# Patient Record
Sex: Female | Born: 1965 | Race: White | Hispanic: No | Marital: Married | Smoking: Current every day smoker
Health system: Southern US, Community
[De-identification: ages and names within clinical notes are randomized; demographics above are authoritative.]

## PROBLEM LIST (undated history)

## (undated) DIAGNOSIS — I10 Essential (primary) hypertension: Secondary | ICD-10-CM

## (undated) DIAGNOSIS — I639 Cerebral infarction, unspecified: Secondary | ICD-10-CM

## (undated) HISTORY — DX: Essential (primary) hypertension: I10

## (undated) HISTORY — DX: Cerebral infarction, unspecified: I63.9

## (undated) HISTORY — PX: TONSILLECTOMY: SUR1361

---

## 2021-03-17 ENCOUNTER — Encounter (HOSPITAL_BASED_OUTPATIENT_CLINIC_OR_DEPARTMENT_OTHER): Payer: Self-pay

## 2021-03-17 ENCOUNTER — Other Ambulatory Visit: Payer: Self-pay

## 2021-03-17 ENCOUNTER — Emergency Department (HOSPITAL_COMMUNITY)
Admission: EM | Admit: 2021-03-17 | Discharge: 2021-03-17 | Disposition: A | Discharge status: Home / Self Care | Payer: Commercial Managed Care - PPO | Attending: Emergency Medicine | Admitting: Emergency Medicine

## 2021-03-17 ENCOUNTER — Emergency Department (HOSPITAL_BASED_OUTPATIENT_CLINIC_OR_DEPARTMENT_OTHER)
Admission: EM | Admit: 2021-03-17 | Discharge: 2021-03-17 | Disposition: A | Discharge status: Home / Self Care | Payer: Commercial Managed Care - PPO | Attending: Emergency Medicine | Admitting: Emergency Medicine

## 2021-03-17 ENCOUNTER — Emergency Department (HOSPITAL_BASED_OUTPATIENT_CLINIC_OR_DEPARTMENT_OTHER): Payer: Commercial Managed Care - PPO

## 2021-03-17 DIAGNOSIS — R5383 Other fatigue: Secondary | ICD-10-CM | POA: Insufficient documentation

## 2021-03-17 DIAGNOSIS — I10 Essential (primary) hypertension: Secondary | ICD-10-CM | POA: Diagnosis not present

## 2021-03-17 DIAGNOSIS — R0789 Other chest pain: Secondary | ICD-10-CM | POA: Diagnosis not present

## 2021-03-17 DIAGNOSIS — F17210 Nicotine dependence, cigarettes, uncomplicated: Secondary | ICD-10-CM | POA: Insufficient documentation

## 2021-03-17 DIAGNOSIS — R030 Elevated blood-pressure reading, without diagnosis of hypertension: Secondary | ICD-10-CM | POA: Diagnosis present

## 2021-03-17 DIAGNOSIS — R519 Headache, unspecified: Secondary | ICD-10-CM | POA: Diagnosis not present

## 2021-03-17 DIAGNOSIS — Z5321 Procedure and treatment not carried out due to patient leaving prior to being seen by health care provider: Secondary | ICD-10-CM | POA: Insufficient documentation

## 2021-03-17 LAB — CBC WITH DIFFERENTIAL/PLATELET
Abs Immature Granulocytes: 0.03 10*3/uL (ref 0.00–0.07)
Basophils Absolute: 0 10*3/uL (ref 0.0–0.1)
Basophils Relative: 1 %
Eosinophils Absolute: 0.2 10*3/uL (ref 0.0–0.5)
Eosinophils Relative: 3 %
HCT: 40.9 % (ref 36.0–46.0)
Hemoglobin: 13.4 g/dL (ref 12.0–15.0)
Immature Granulocytes: 0 %
Lymphocytes Relative: 31 %
Lymphs Abs: 2.2 10*3/uL (ref 0.7–4.0)
MCH: 31.9 pg (ref 26.0–34.0)
MCHC: 32.8 g/dL (ref 30.0–36.0)
MCV: 97.4 fL (ref 80.0–100.0)
Monocytes Absolute: 0.4 10*3/uL (ref 0.1–1.0)
Monocytes Relative: 6 %
Neutro Abs: 4.2 10*3/uL (ref 1.7–7.7)
Neutrophils Relative %: 59 %
Platelets: 211 10*3/uL (ref 150–400)
RBC: 4.2 MIL/uL (ref 3.87–5.11)
RDW: 14.3 % (ref 11.5–15.5)
WBC: 7.1 10*3/uL (ref 4.0–10.5)
nRBC: 0 % (ref 0.0–0.2)

## 2021-03-17 LAB — TROPONIN I (HIGH SENSITIVITY): Troponin I (High Sensitivity): 3 ng/L (ref <18)

## 2021-03-17 LAB — COMPREHENSIVE METABOLIC PANEL
ALT: 13 U/L (ref 0–44)
AST: 19 U/L (ref 15–41)
Albumin: 3.5 g/dL (ref 3.5–5.0)
Alkaline Phosphatase: 48 U/L (ref 38–126)
Anion gap: 8 (ref 5–15)
BUN: 15 mg/dL (ref 6–20)
CO2: 29 mmol/L (ref 22–32)
Calcium: 8.4 mg/dL — ABNORMAL LOW (ref 8.9–10.3)
Chloride: 101 mmol/L (ref 98–111)
Creatinine, Ser: 1.04 mg/dL — ABNORMAL HIGH (ref 0.44–1.00)
GFR, Estimated: >60 mL/min (ref >60)
Glucose, Bld: 125 mg/dL — ABNORMAL HIGH (ref 70–99)
Potassium: 3.5 mmol/L (ref 3.5–5.1)
Sodium: 138 mmol/L (ref 135–145)
Total Bilirubin: 0.3 mg/dL (ref 0.3–1.2)
Total Protein: 6.4 g/dL — ABNORMAL LOW (ref 6.5–8.1)

## 2021-03-17 NOTE — Discharge Instructions (Addendum)
Increase your lisinopril to 40 mg once daily.  Follow up with your primary care doctor or cardiologist in the next 1-2 weeks.

## 2021-03-17 NOTE — ED Notes (Signed)
Pt stated she is leaving because she is too concerned about her BP

## 2021-03-17 NOTE — ED Provider Notes (Signed)
[facility] EMERGENCY DEPARTMENT Provider Note   CSN: [id number] Arrival date & time: 03/17/21  2035     History Chief Complaint  Patient presents with   Hypertension    Natalie Massey is a 55 y.o. female.  The history is provided by the patient and medical records.  Hypertension Natalie Massey is a 55 y.o. female who presents to the Emergency Department complaining of HTN.  She has a hx/o HTN.  Takes lisinopril, bisoprolol, clonidine.  No recent changes or missed doses.    Today she is fatigued.  She states if you touch her chest she feels pressure starting today.  She is having some difficulty with focusing/vision for the last year.  Has afternoon headaches - on and off for the last two weeks. No HA today.    Checks her blood pressure at home and lately has been running 190/110-120.  Has night sweats for the last 2-3 months.  Has intermittent sob, thought it was related to having covid - possibly three months ago.   No cough, abdominal, vomiting, diarrhea. No leg edema.      Past Medical History:  Diagnosis Date   Hypertension    Stroke [facility])     There are no problems to display for this patient.   Past Surgical History:  Procedure Laterality Date   CESAREAN SECTION     TONSILLECTOMY       OB History   No obstetric history on file.     No family history on file.  Social History   Tobacco Use   Smoking status: Every Day    Pack years: 0.00    Types: Cigarettes   Smokeless tobacco: Never  Substance Use Topics   Alcohol use: Yes    Comment: occ   Drug use: Never    Home Medications Prior to Admission medications   Not on File    Allergies    Benadryl [diphenhydramine], Hydrocodone, and Prochlorperazine  Review of Systems   Review of Systems  All other systems reviewed and are negative.  Physical Exam Updated Vital Signs BP (!) 180/105   Pulse 64   Temp 98.9 F (37.2 C) (Oral)   Resp 14   Ht 5\' 6"  (1.676 m)   Wt 73.5 kg    SpO2 91%   BMI 26.15 kg/m   Physical Exam Vitals and nursing note reviewed.  Constitutional:      Appearance: She is well-developed.  HENT:     Head: Normocephalic and atraumatic.  Cardiovascular:     Rate and Rhythm: Normal rate and regular rhythm.     Heart sounds: No murmur heard. Pulmonary:     Effort: Pulmonary effort is normal. No respiratory distress.     Breath sounds: Normal breath sounds.  Abdominal:     Palpations: Abdomen is soft.     Tenderness: There is no abdominal tenderness. There is no guarding or rebound.  Musculoskeletal:        General: No tenderness.  Skin:    General: Skin is warm and dry.  Neurological:     Mental Status: She is alert and oriented to person, place, and time.     Comments: No asymmetry of facial movements.  Visual fields grossly intact.  No pronator drift.  5/5 strength in all four extremities with sensation to light touch intact in all four extremities.    Psychiatric:        Behavior: Behavior normal.    ED Results / Procedures / Treatments  Labs (all labs ordered are listed, but only abnormal results are displayed) Labs Reviewed  COMPREHENSIVE METABOLIC PANEL - Abnormal; Notable for the following components:      Result Value   Glucose, Bld 125 (*)    Creatinine, Ser 1.04 (*)    Calcium 8.4 (*)    Total Protein 6.4 (*)    All other components within normal limits  CBC WITH DIFFERENTIAL/PLATELET  TROPONIN I (HIGH SENSITIVITY)  TROPONIN I (HIGH SENSITIVITY)    EKG EKG Interpretation  Date/Time:  Wednesday March 17 2021 20:50:23 EDT Ventricular Rate:  72 PR Interval:  142 QRS Duration: 100 QT Interval:  434 QTC Calculation: 475 R Axis:   74 Text Interpretation: Normal sinus rhythm Nonspecific ST abnormality Abnormal ECG Confirmed by Tilden Fossa [phone]) on 03/17/2021 10:40:13 PM  Radiology DG Chest 2 View  Result Date: 03/17/2021 CLINICAL DATA:  Chest pain hypertension EXAM: CHEST - 2 VIEW COMPARISON:  None.  FINDINGS: The heart size and mediastinal contours are within normal limits. Both lungs are clear. The visualized skeletal structures are unremarkable. IMPRESSION: No active cardiopulmonary disease. Electronically Signed   By: Jasmine Pang M.D.   On: 03/17/2021 21:50   CT Head Wo Contrast  Result Date: 03/17/2021 CLINICAL DATA:  Headache. EXAM: CT HEAD WITHOUT CONTRAST TECHNIQUE: Contiguous axial images were obtained from the base of the skull through the vertex without intravenous contrast. COMPARISON:  None. FINDINGS: Brain: No evidence of acute infarction, hemorrhage, hydrocephalus, extra-axial collection or mass lesion/mass effect. Vascular: No hyperdense vessel or unexpected calcification. Skull: Normal. Negative for fracture or focal lesion. Sinuses/Orbits: No acute finding. Other: None. IMPRESSION: No acute intracranial abnormality. Electronically Signed   By: Aram Candela M.D.   On: 03/17/2021 21:47    Procedures Procedures   Medications Ordered in ED Medications - No data to display  ED Course  I have reviewed the triage vital signs and the nursing notes.  Pertinent labs & imaging results that were available during my care of the patient were reviewed by me and considered in my medical decision making (see chart for details).    MDM Rules/Calculators/A&P                         patient here for evaluation of concern for elevated blood pressure. She does have a history of hypertension. She has been having intermittent headaches, vision changes for about one year as well as 2 to 3 months of intermittent diaphoresis. Today she did have chest pressure as well. Troponin is negative today. She was significantly hypertensive on ED presentation, blood pressure did improve without intervention. Presentation is not consistent with CVA, hypertensive emergency, acute pulmonary edema, ACS, PE, dissection. It will increase her lisinopril with close outpatient follow-up and return  precautions.  Final Clinical Impression(s) / ED Diagnoses Final diagnoses:  Essential hypertension    Rx / DC Orders ED Discharge Orders     None        Tilden Fossa, MD 03/18/21 [phone]

## 2021-03-17 NOTE — ED Triage Notes (Addendum)
Pt c/o BP elevated x 3 days-states she checked BP due to c/o "HA, feeling shaky" and vision changes-reports compliant HTN-pt NAD-steady gait-pt later added c/o "chest pressure" x today-denies as pain for pain scale

## 2021-12-30 ENCOUNTER — Emergency Department (HOSPITAL_BASED_OUTPATIENT_CLINIC_OR_DEPARTMENT_OTHER)
Admission: EM | Admit: 2021-12-30 | Discharge: 2021-12-30 | Disposition: A | Discharge status: Home / Self Care | Payer: Commercial Managed Care - PPO | Attending: Emergency Medicine | Admitting: Emergency Medicine

## 2021-12-30 ENCOUNTER — Encounter (HOSPITAL_BASED_OUTPATIENT_CLINIC_OR_DEPARTMENT_OTHER): Payer: Self-pay | Admitting: Urology

## 2021-12-30 ENCOUNTER — Emergency Department (HOSPITAL_BASED_OUTPATIENT_CLINIC_OR_DEPARTMENT_OTHER): Payer: Commercial Managed Care - PPO

## 2021-12-30 ENCOUNTER — Other Ambulatory Visit: Payer: Self-pay

## 2021-12-30 DIAGNOSIS — R202 Paresthesia of skin: Secondary | ICD-10-CM | POA: Diagnosis present

## 2021-12-30 DIAGNOSIS — I10 Essential (primary) hypertension: Secondary | ICD-10-CM | POA: Diagnosis not present

## 2021-12-30 DIAGNOSIS — R519 Headache, unspecified: Secondary | ICD-10-CM | POA: Diagnosis not present

## 2021-12-30 DIAGNOSIS — M25512 Pain in left shoulder: Secondary | ICD-10-CM | POA: Insufficient documentation

## 2021-12-30 LAB — BASIC METABOLIC PANEL
Anion gap: 8 (ref 5–15)
BUN: 10 mg/dL (ref 6–20)
CO2: 29 mmol/L (ref 22–32)
Calcium: 8.6 mg/dL — ABNORMAL LOW (ref 8.9–10.3)
Chloride: 101 mmol/L (ref 98–111)
Creatinine, Ser: 1.23 mg/dL — ABNORMAL HIGH (ref 0.44–1.00)
GFR, Estimated: 52 mL/min — ABNORMAL LOW (ref >60)
Glucose, Bld: 94 mg/dL (ref 70–99)
Potassium: 4.1 mmol/L (ref 3.5–5.1)
Sodium: 138 mmol/L (ref 135–145)

## 2021-12-30 LAB — CBC WITH DIFFERENTIAL/PLATELET
Abs Immature Granulocytes: 0.03 10*3/uL (ref 0.00–0.07)
Basophils Absolute: 0 10*3/uL (ref 0.0–0.1)
Basophils Relative: 0 %
Eosinophils Absolute: 0.2 10*3/uL (ref 0.0–0.5)
Eosinophils Relative: 3 %
HCT: 45.7 % (ref 36.0–46.0)
Hemoglobin: 14.7 g/dL (ref 12.0–15.0)
Immature Granulocytes: 0 %
Lymphocytes Relative: 34 %
Lymphs Abs: 3 10*3/uL (ref 0.7–4.0)
MCH: 31.3 pg (ref 26.0–34.0)
MCHC: 32.2 g/dL (ref 30.0–36.0)
MCV: 97.2 fL (ref 80.0–100.0)
Monocytes Absolute: 0.7 10*3/uL (ref 0.1–1.0)
Monocytes Relative: 8 %
Neutro Abs: 4.9 10*3/uL (ref 1.7–7.7)
Neutrophils Relative %: 55 %
Platelets: 240 10*3/uL (ref 150–400)
RBC: 4.7 MIL/uL (ref 3.87–5.11)
RDW: 14.2 % (ref 11.5–15.5)
WBC: 8.8 10*3/uL (ref 4.0–10.5)
nRBC: 0 % (ref 0.0–0.2)

## 2021-12-30 MED ORDER — DEXAMETHASONE SODIUM PHOSPHATE 10 MG/ML IJ SOLN
10.0000 mg | Freq: Once | INTRAMUSCULAR | Status: AC
  Administered 2021-12-30: 10 mg via INTRAVENOUS
  Filled 2021-12-30: qty 1

## 2021-12-30 MED ORDER — METHYLPREDNISOLONE 4 MG PO TBPK: ORAL_TABLET | ORAL | 0 refills | Status: AC

## 2021-12-30 NOTE — ED Triage Notes (Addendum)
Numbness and tingling to left arm that started 2 days ago,  states left eye swelling  ?States unable to straighten arm, denies weakness ?BEFAST and VAN neg ?H/o Stoke, facial droop at baseline ?States shakiness normal at baseline ? ?Pt hypertensive at triage ? ?

## 2021-12-30 NOTE — ED Notes (Signed)
Patient transported to CT 

## 2021-12-30 NOTE — Discharge Instructions (Signed)
Take next dose of steroid tomorrow.  Follow-up with your neurologist. ?

## 2021-12-30 NOTE — ED Provider Notes (Signed)
?[facility] EMERGENCY DEPARTMENT ?Provider Note ? ? ?CSN: [id number] ?Arrival date & time: 12/30/21  1855 ? ?  ? ?History ? ?Chief Complaint  ?Patient presents with  ? Numbness  ? ? ?Natalie Massey is a 56 y.o. female. ? ?Patient with history of hypertension, possibly stroke who presents to the ED with numbness in the left upper arm.  She is having paresthesias and pain and numbness between her left shoulder and left elbow.  Denies any weakness.  Denies any trauma.  Denies any neck pain.  Does have intermittent headaches at times.  She has been dealing with tremors especially in her upper extremities for the last several months.  She has referral to neurology for this.  Denies any falls.  Denies any vision issues or vision loss at this time.  Paresthesias have been ongoing for about 3 days.  Has history of nerve issues but none in the upper extremities. ? ?The history is provided by the patient.  ?Illness ?Severity:  Mild ?Onset quality:  Gradual ?Duration:  3 days ?Timing:  Constant ?Progression:  Unchanged ?Chronicity:  New ?Associated symptoms: no abdominal pain, no chest pain, no congestion, no cough, no diarrhea, no ear pain, no fatigue, no fever, no headaches, no loss of consciousness, no myalgias, no rash, no rhinorrhea, no shortness of breath, no sore throat, no vomiting and no wheezing   ? ?  ? ?Home Medications ?Prior to Admission medications   ?Medication Sig Start Date End Date Taking? Authorizing Provider  ?methylPREDNISolone (MEDROL DOSEPAK) 4 MG TBPK tablet Follow package insert 12/30/21  Yes Virgina Norfolk, DO  ?   ? ?Allergies    ?Benadryl [diphenhydramine], Hydrocodone, and Prochlorperazine   ? ?Review of Systems   ?Review of Systems  ?Constitutional:  Negative for fatigue and fever.  ?HENT:  Negative for congestion, ear pain, rhinorrhea and sore throat.   ?Respiratory:  Negative for cough, shortness of breath and wheezing.   ?Cardiovascular:  Negative for chest pain.  ?Gastrointestinal:   Negative for abdominal pain, diarrhea and vomiting.  ?Musculoskeletal:  Negative for myalgias.  ?Skin:  Negative for rash.  ?Neurological:  Negative for loss of consciousness and headaches.  ? ?Physical Exam ?Updated Vital Signs ?BP (!) 188/110   Pulse 72   Temp 98.7 ?F (37.1 ?C) (Oral)   Resp 18   Ht 5\' 6"  (1.676 m)   Wt 73.5 kg   SpO2 94%   BMI 26.15 kg/m?  ?Physical Exam ?Vitals and nursing note reviewed.  ?Constitutional:   ?   General: She is not in acute distress. ?   Appearance: She is well-developed. She is not ill-appearing.  ?HENT:  ?   Head: Normocephalic and atraumatic.  ?   Nose: Nose normal.  ?   Mouth/Throat:  ?   Mouth: Mucous membranes are moist.  ?Eyes:  ?   Extraocular Movements: Extraocular movements intact.  ?   Conjunctiva/sclera: Conjunctivae normal.  ?   Pupils: Pupils are equal, round, and reactive to light.  ?Cardiovascular:  ?   Rate and Rhythm: Normal rate and regular rhythm.  ?   Pulses: Normal pulses.  ?   Heart sounds: Normal heart sounds. No murmur heard. ?Pulmonary:  ?   Effort: Pulmonary effort is normal. No respiratory distress.  ?   Breath sounds: Normal breath sounds.  ?Abdominal:  ?   General: Abdomen is flat.  ?   Palpations: Abdomen is soft.  ?   Tenderness: There is no abdominal tenderness.  ?Musculoskeletal:     ?  General: No swelling or tenderness.  ?   Cervical back: Normal range of motion and neck supple.  ?   Comments: Tender to palpation to the left shoulder  ?Skin: ?   General: Skin is warm and dry.  ?   Capillary Refill: Capillary refill takes less than 2 seconds.  ?Neurological:  ?   Mental Status: She is alert.  ?   Cranial Nerves: No cranial nerve deficit.  ?   Sensory: Sensory deficit present.  ?   Motor: No weakness.  ?   Coordination: Coordination normal.  ?   Comments: Patient has some tremors in the upper extremities but 5+ out of 5 strength throughout, decreased sensation along the ulnar portion of the left arm between mid humerus and mid wrist but  otherwise sensations intact otherwise, normal speech, no drift, normal finger-nose-finger, no visual field deficit  ?Psychiatric:     ?   Mood and Affect: Mood normal.  ? ? ?ED Results / Procedures / Treatments   ?Labs ?(all labs ordered are listed, but only abnormal results are displayed) ?Labs Reviewed  ?BASIC METABOLIC PANEL - Abnormal; Notable for the following components:  ?    Result Value  ? Creatinine, Ser 1.23 (*)   ? Calcium 8.6 (*)   ? GFR, Estimated 52 (*)   ? All other components within normal limits  ?CBC WITH DIFFERENTIAL/PLATELET  ? ? ?EKG ?EKG Interpretation ? ?Date/Time:  Thursday December 30 2021 19:10:22 EDT ?Ventricular Rate:  77 ?PR Interval:  132 ?QRS Duration: 108 ?QT Interval:  414 ?QTC Calculation: 468 ?R Axis:   59 ?Text Interpretation: Normal sinus rhythm Incomplete left bundle branch block When compared with ECG of 17-Mar-2021 20:50, PREVIOUS ECG IS PRESENT Confirmed by Virgina Norfolk [phone]) on 12/30/2021 7:14:11 PM ? ?Radiology ?CT Head Wo Contrast ? ?Result Date: 12/30/2021 ?CLINICAL DATA:  Headache, new or worsening (Age >= 50y) numbness, headaceh EXAM: CT HEAD WITHOUT CONTRAST TECHNIQUE: Contiguous axial images were obtained from the base of the skull through the vertex without intravenous contrast. RADIATION DOSE REDUCTION: This exam was performed according to the departmental dose-optimization program which includes automated exposure control, adjustment of the mA and/or kV according to patient size and/or use of iterative reconstruction technique. COMPARISON:  CT head March 17, 2021. FINDINGS: Brain: No evidence of acute infarction, hemorrhage, hydrocephalus, extra-axial collection or mass lesion/mass effect. Vascular: No hyperdense vessel identified. Skull: No acute fracture. Sinuses/Orbits: Moderate right maxillary sinus mucosal thickening and mild right frontal sinus mucosal thickening. Other: No mastoid effusions. IMPRESSION: 1. No evidence of acute intracranial abnormality. 2.  Moderate right maxillary sinus mucosal thickening. Electronically Signed   By: Feliberto Harts M.D.   On: 12/30/2021 19:46  ? ?DG Shoulder Left ? ?Result Date: 12/30/2021 ?CLINICAL DATA:  Pain EXAM: LEFT SHOULDER - 2+ VIEW COMPARISON:  None. FINDINGS: There is no evidence of fracture or dislocation. There is no evidence of arthropathy or other focal bone abnormality. Soft tissues are unremarkable. IMPRESSION: Negative. Electronically Signed   By: Guadlupe Spanish M.D.   On: 12/30/2021 19:54   ? ?Procedures ?Procedures  ? ? ?Medications Ordered in ED ?Medications  ?dexamethasone (DECADRON) injection 10 mg (10 mg Intravenous Given 12/30/21 2003)  ? ? ?ED Course/ Medical Decision Making/ A&P ?  ?                        ?Medical Decision Making ?Amount and/or Complexity of Data Reviewed ?Labs: ordered. ?Radiology: ordered. ? ?  Risk ?Prescription drug management. ? ? ?Westley ChandlerMelissa Chatham is here for evaluation of paresthesias to the left arm.  Discomfort mostly between the mid humerus on the left on the ulnar side to the mid wrist.  She has a lot of discomfort in the left shoulder radiating down from this.  Denies any trauma or falls.  Ongoing for the last 3 days.  Denies any neck pain, headaches, vision changes, weakness currently.  Has had nerve issues in the past.  Has a history of high blood pressure, possibly stroke.  She has had tremors in her upper extremities for a long time now and is supposed to see neurology for this.  Overall this does not fit stroke.  Suspect that this could be some peripheral nerve issue or muscular issue.  But we will pursue CT scan of the head, x-ray of the left shoulder and check basic labs.  Blood pressure elevated upon arrival but otherwise normal vitals.  She is not had her nighttime blood pressure medicines.  She is having some tremors and that might be affecting her blood pressure reading as well. ? ?CT scan of the head shows no acute findings.  No head bleed.  No evidence of old strokes.   X-ray per my review interpretation of her left shoulder shows no fracture dislocation or severe arthritis.  She has no significant electrolyte abnormality, kidney injury, leukocytosis.  Overall we will treat with s

## 2022-06-24 IMAGING — DX DG SHOULDER 2+V*L*
4 series · 4 of 4 positions shown · non-contrast
Comparison: None.

CLINICAL DATA: Pain

EXAM:
LEFT SHOULDER - 2+ VIEW

[shoulder grashey]
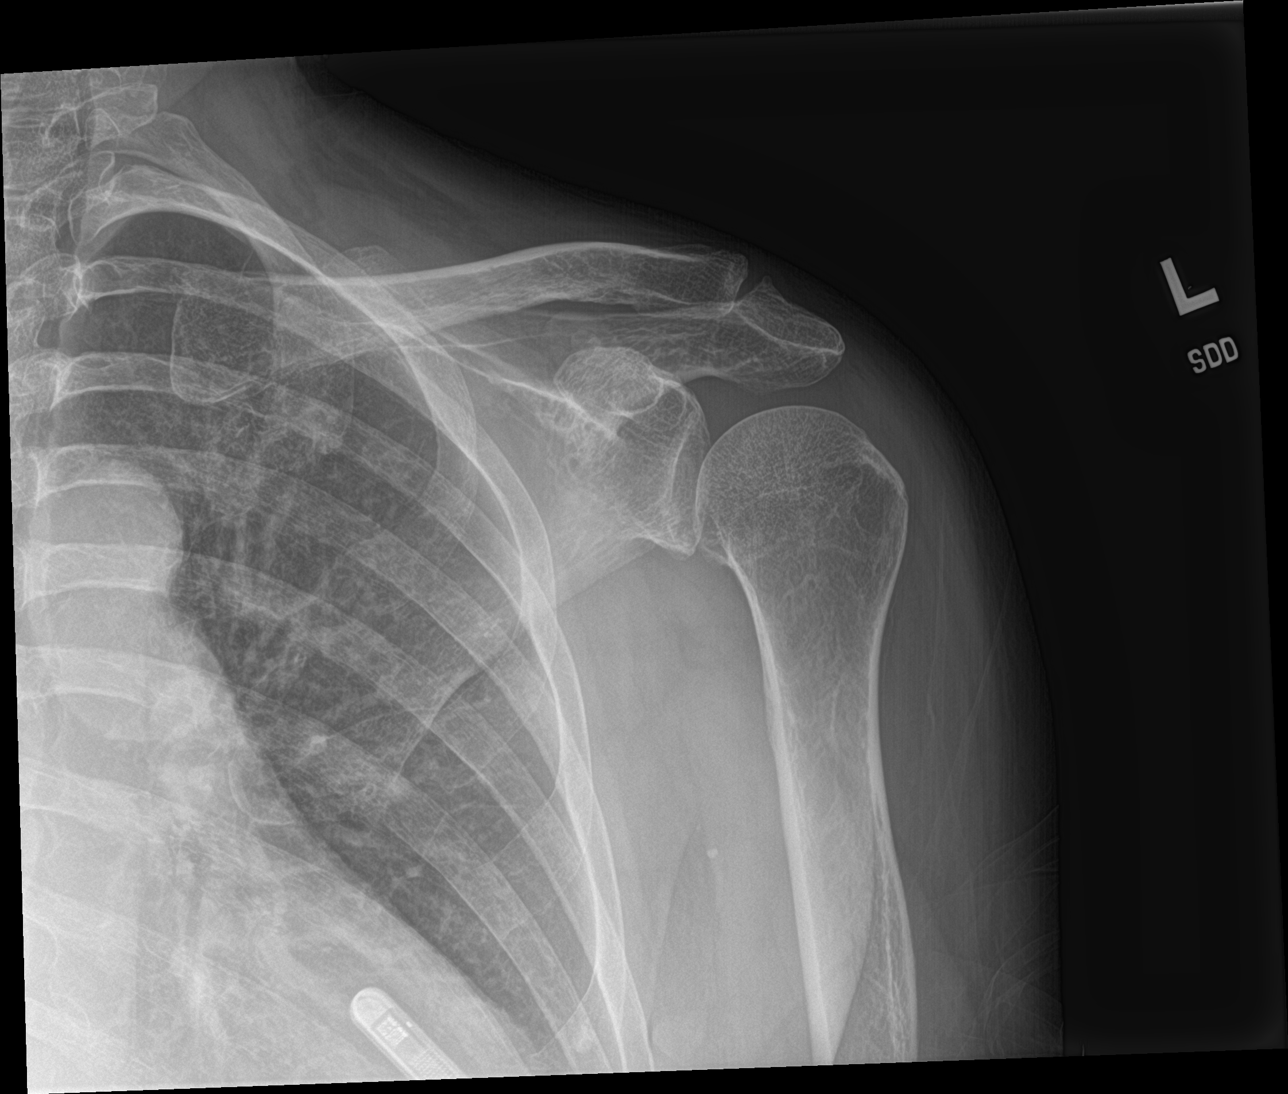

[shoulder y view]
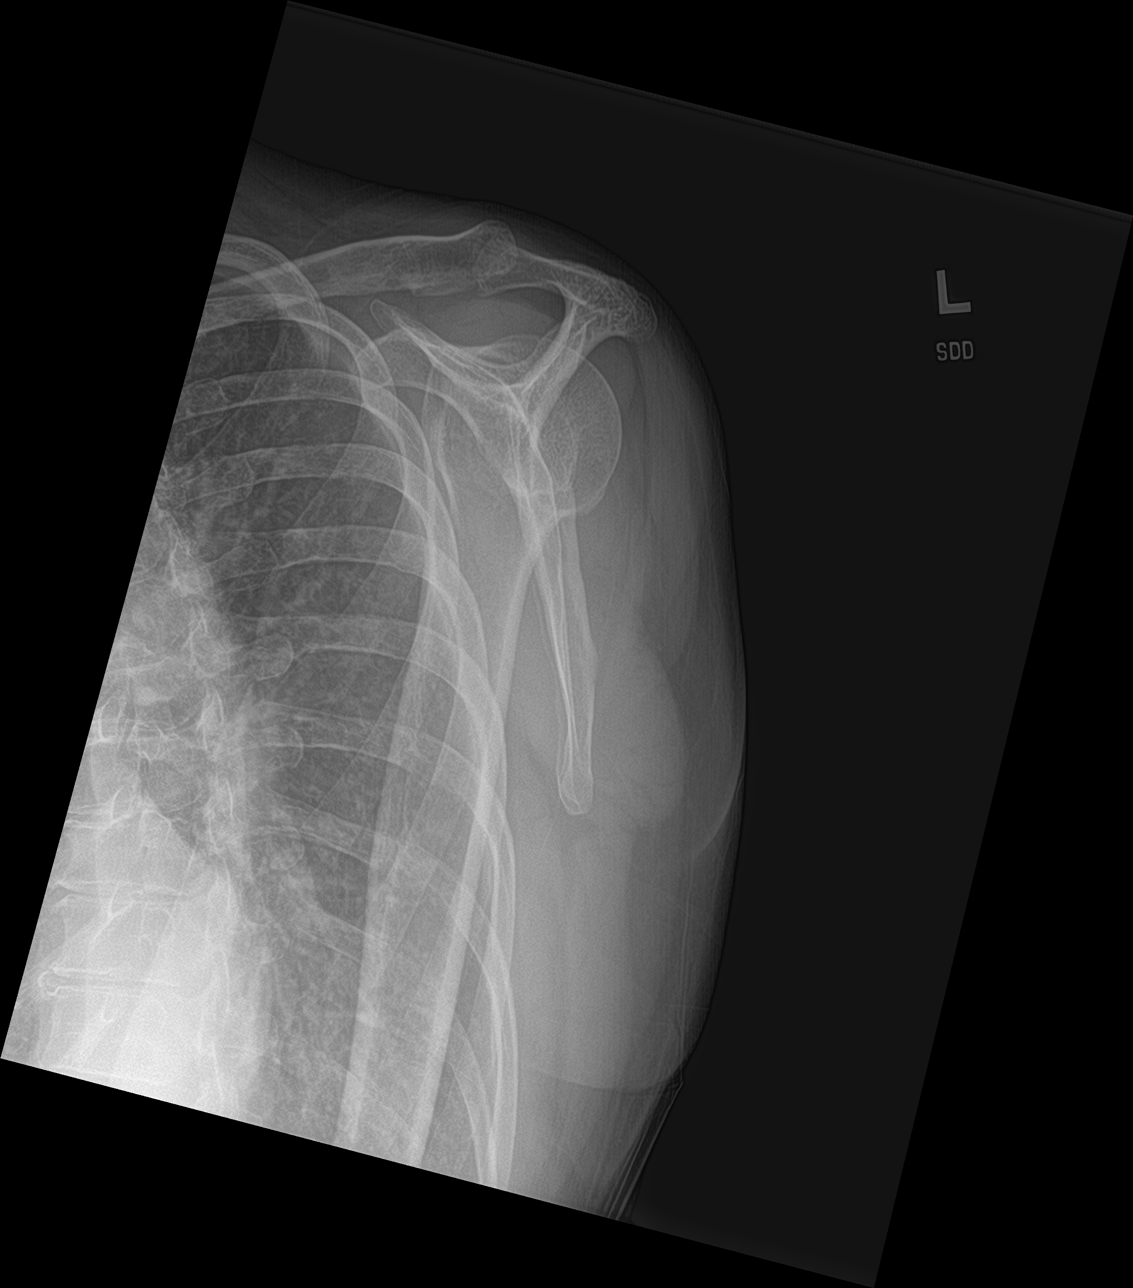

[shoulder axillary]
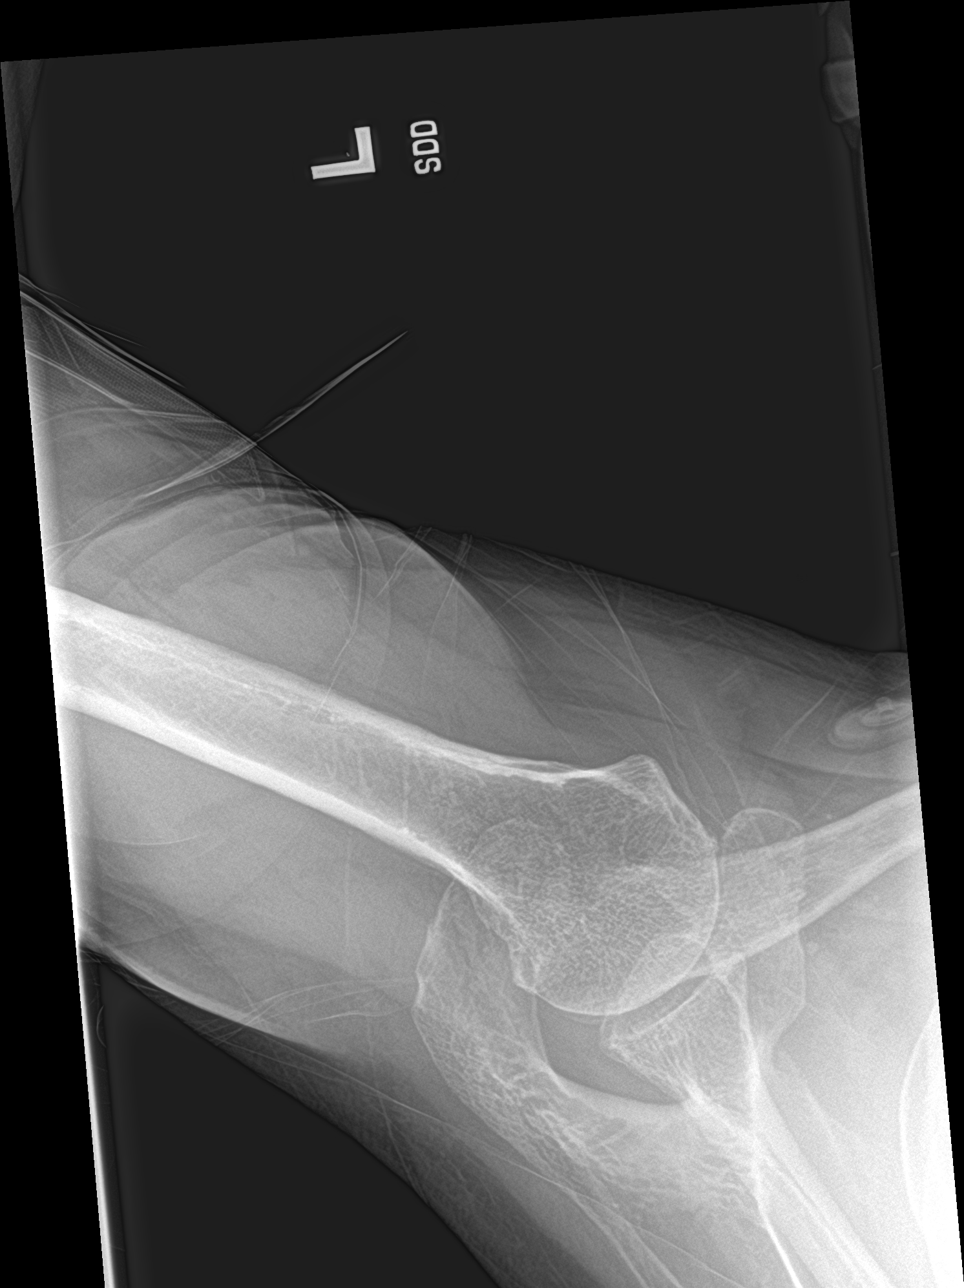

[shoulder ap neutral]
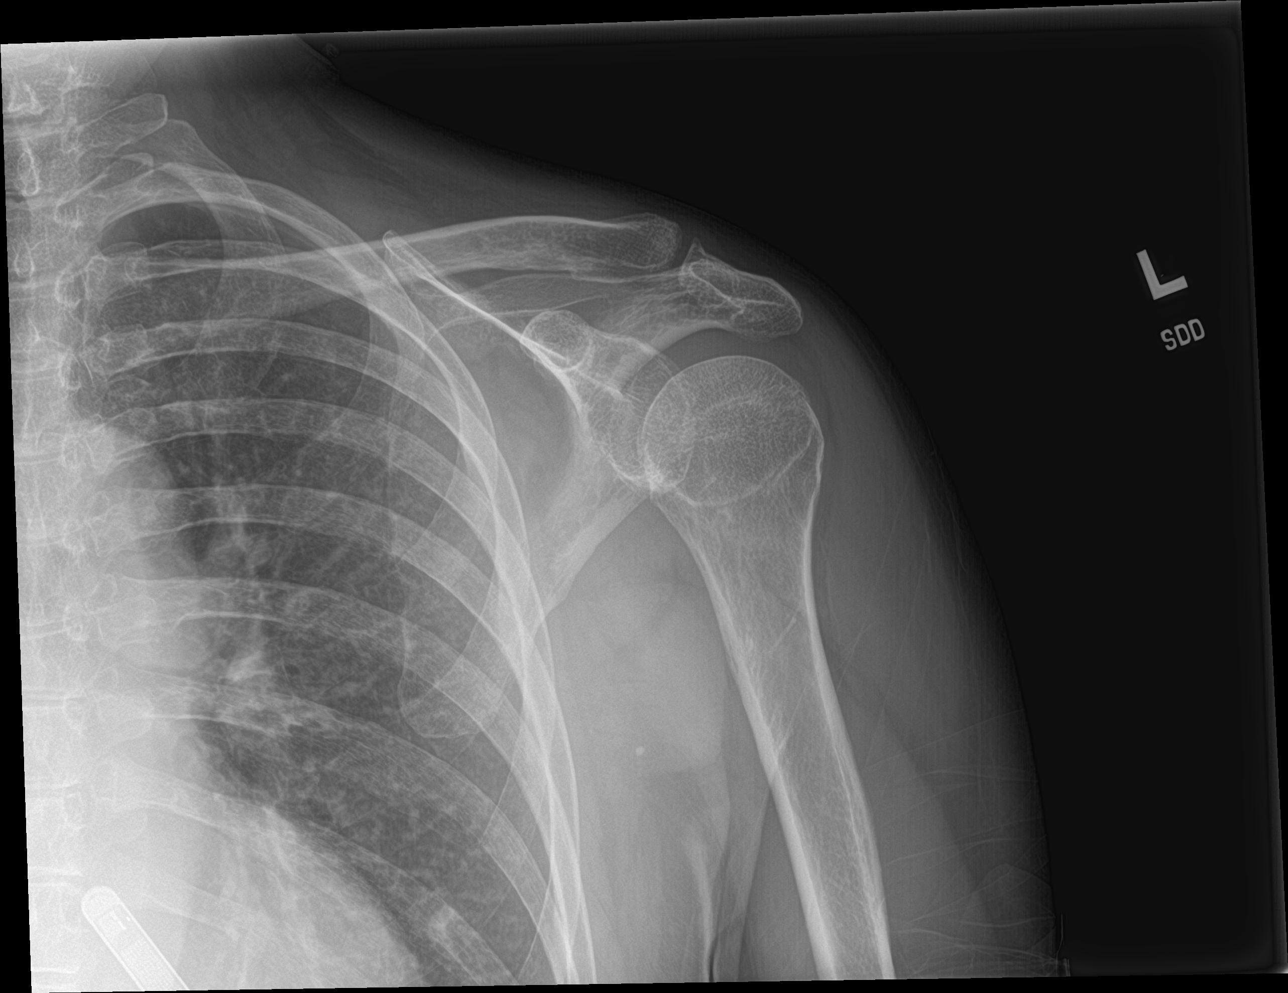

[4 of 4 positions shown; findings below may reference images not displayed]

FINDINGS: There is no evidence of fracture or dislocation. There is no
evidence of arthropathy or other focal bone abnormality. Soft
tissues are unremarkable.
IMPRESSION: Negative.

## 2022-06-24 IMAGING — CT CT HEAD W/O CM
3 series · 14 of 47 positions shown, 16 images · non-contrast
Comparison: CT head March 17, 2021.

CLINICAL DATA: Headache, new or worsening (Age >= 50y) numbness,
headaceh



[Series 2: head wo · axial · 0.47mm/px · z∈[-82,+74]mm · 8 of 37 slices shown, 10 images]
[im 3/37  brain]
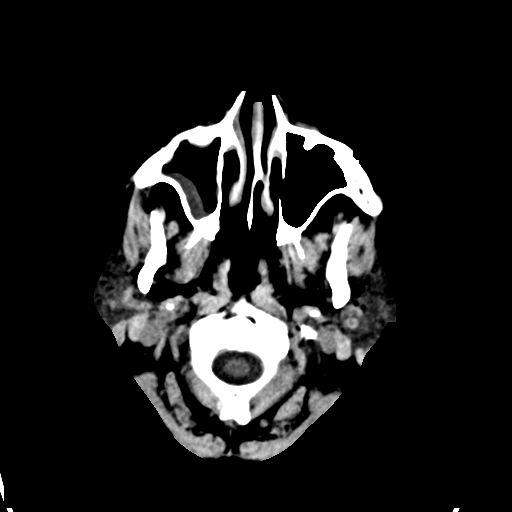
[im 3/37  bone]
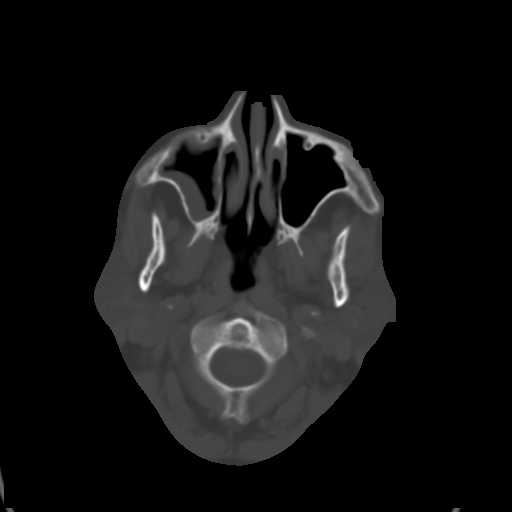
[im 8/37  brain]
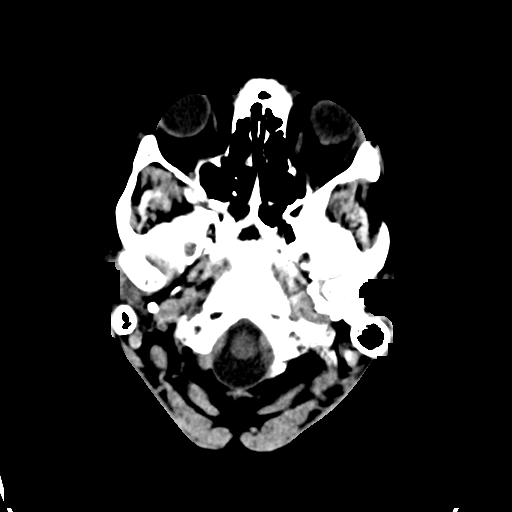
[im 12/37  brain]
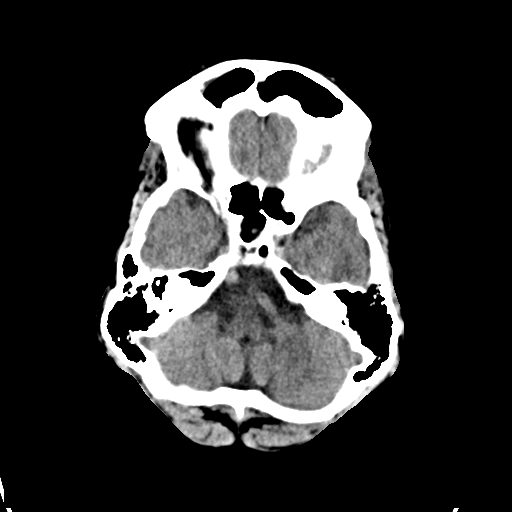
[im 17/37  brain]
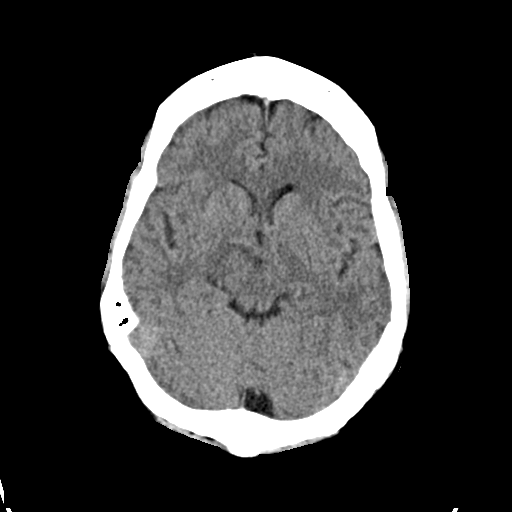
[im 20/37  brain]
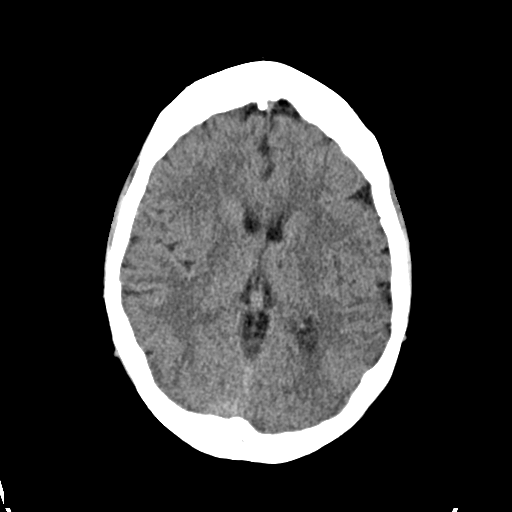
[im 20/37  bone]
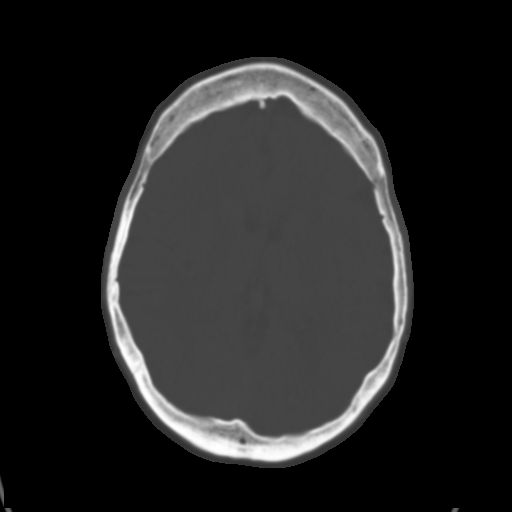
[im 25/37  brain]
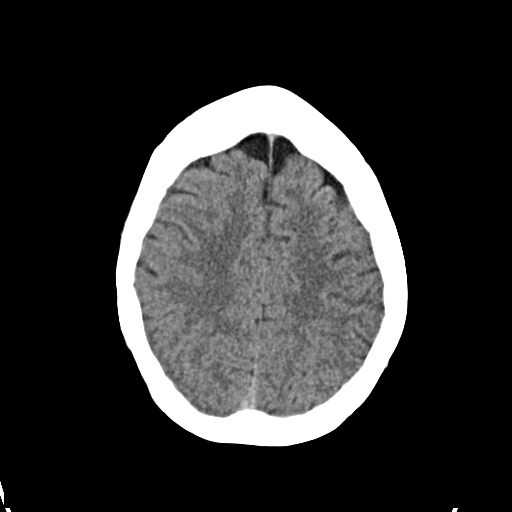
[im 29/37  brain]
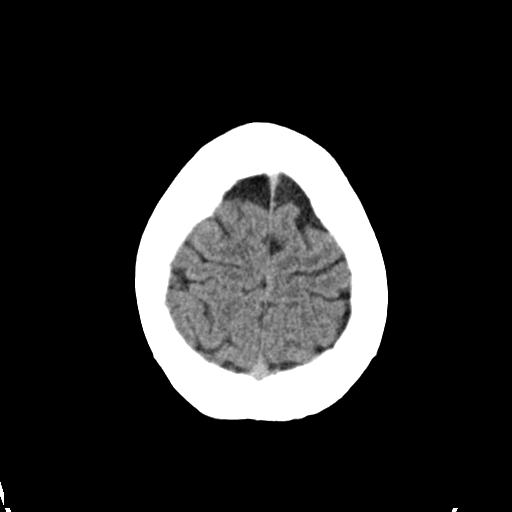
[im 34/37  brain]
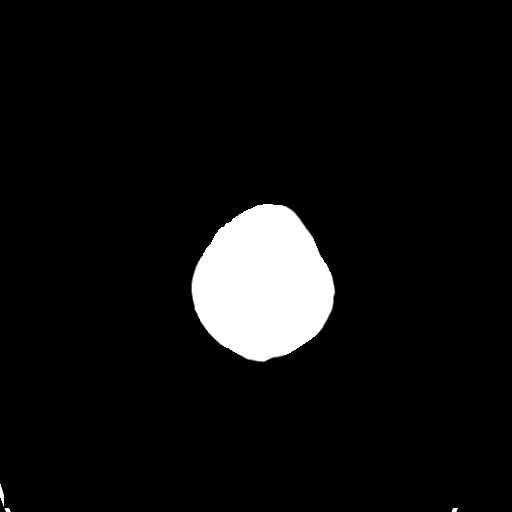

[Series 4: coronal soft · coronal · 0.36mm/px · 3 of 69 slices shown]
[im 23/69  brain]
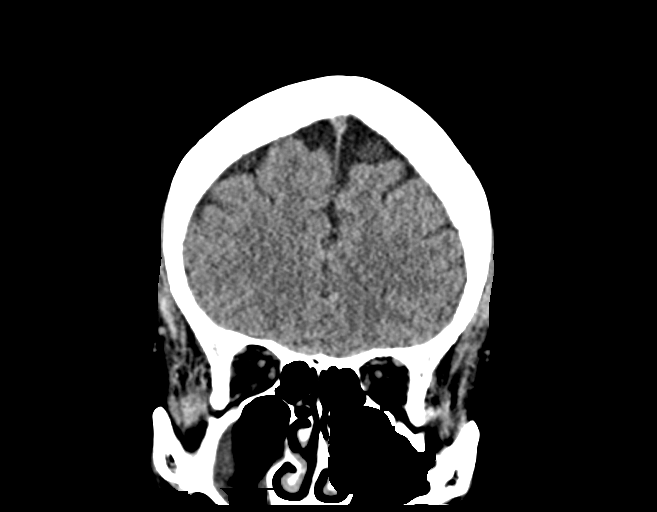
[im 31/69  brain]
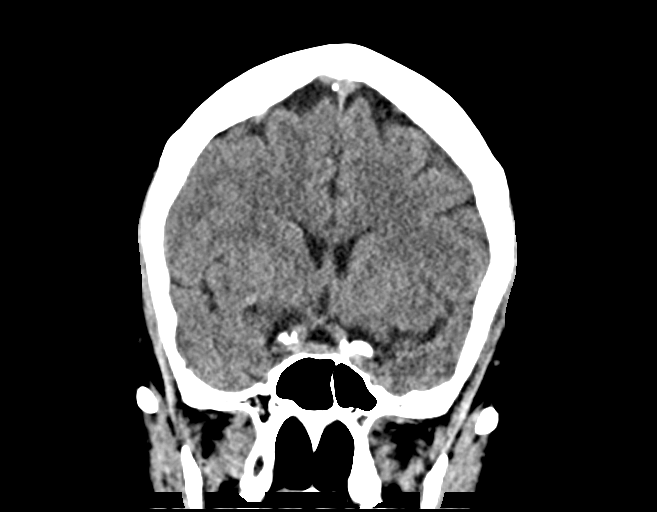
[im 38/69  brain]
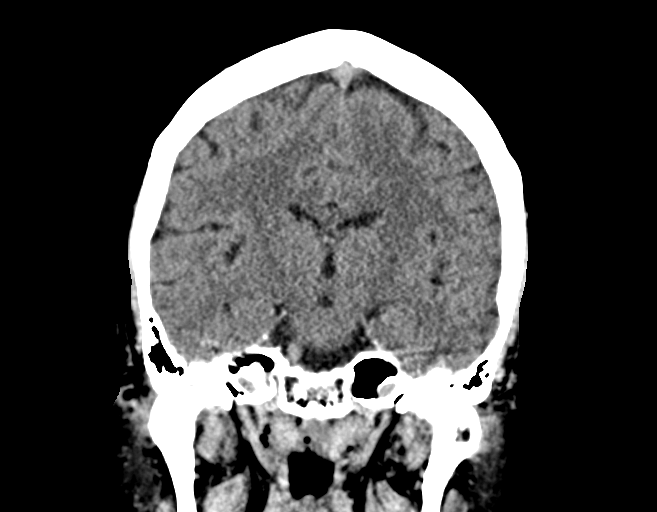

[Series 5: sag soft · sagittal · 0.36mm/px · 3 of 67 slices shown]
[im 23/67  brain]
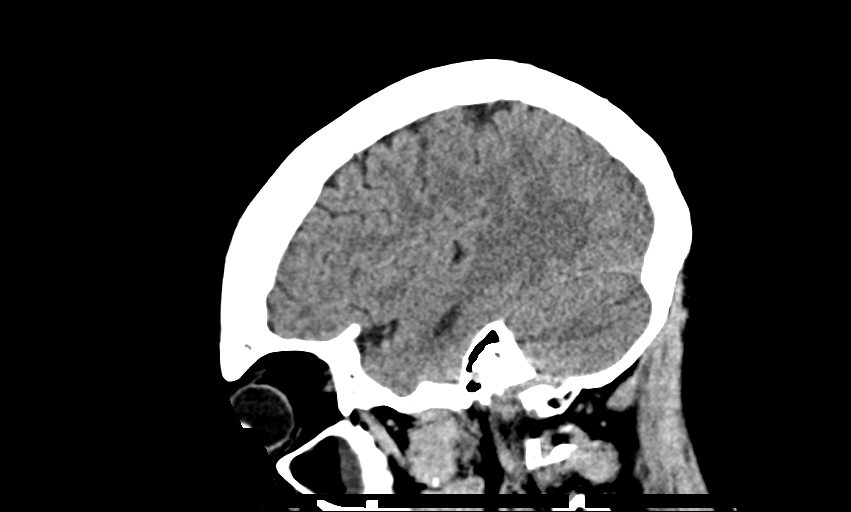
[im 34/67  brain]
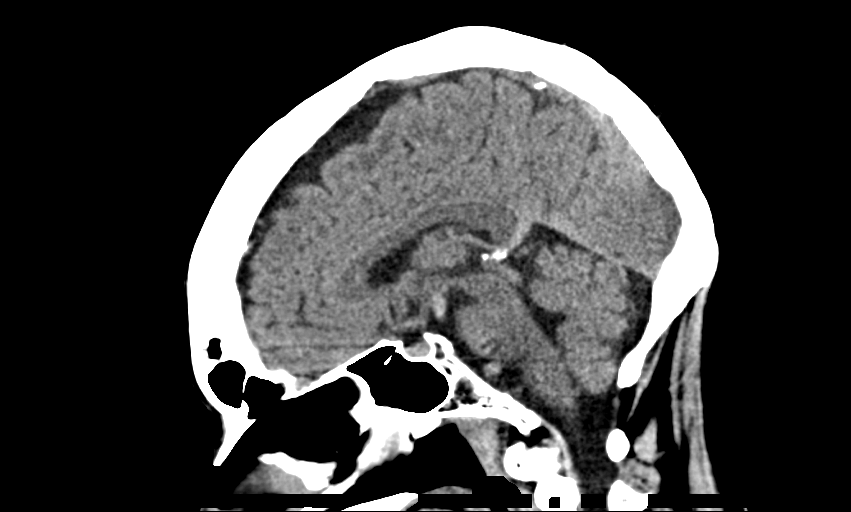
[im 45/67  brain]
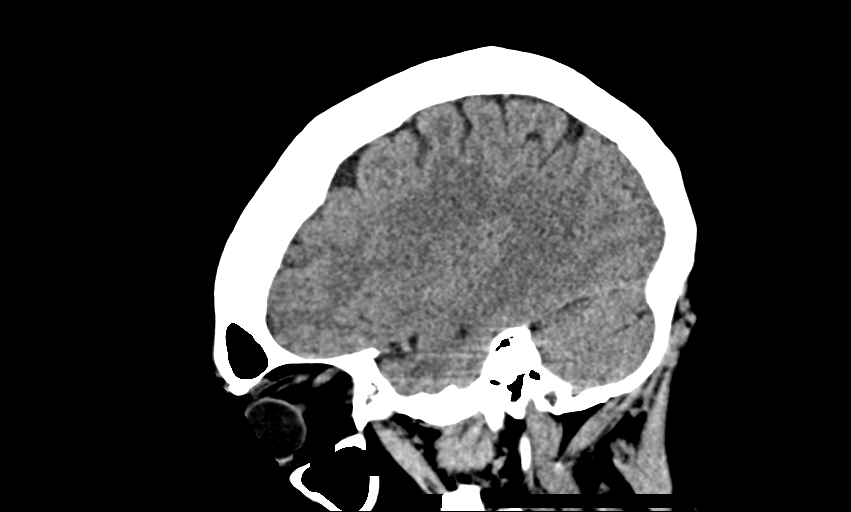

[14 of 47 positions shown; findings below may reference images not displayed]

FINDINGS: Brain: No evidence of acute infarction, hemorrhage, hydrocephalus,
extra-axial collection or mass lesion/mass effect.

Vascular: No hyperdense vessel identified.

Skull: No acute fracture.

Sinuses/Orbits: Moderate right maxillary sinus mucosal thickening
and mild right frontal sinus mucosal thickening.

Other: No mastoid effusions.
IMPRESSION: 1. No evidence of acute intracranial abnormality.
2. Moderate right maxillary sinus mucosal thickening.

## 2022-07-23 ENCOUNTER — Other Ambulatory Visit: Payer: Self-pay

## 2022-07-23 ENCOUNTER — Encounter (HOSPITAL_BASED_OUTPATIENT_CLINIC_OR_DEPARTMENT_OTHER): Payer: Self-pay

## 2022-07-23 DIAGNOSIS — S92352A Displaced fracture of fifth metatarsal bone, left foot, initial encounter for closed fracture: Secondary | ICD-10-CM | POA: Insufficient documentation

## 2022-07-23 DIAGNOSIS — S92342A Displaced fracture of fourth metatarsal bone, left foot, initial encounter for closed fracture: Secondary | ICD-10-CM | POA: Diagnosis not present

## 2022-07-23 DIAGNOSIS — W01198A Fall on same level from slipping, tripping and stumbling with subsequent striking against other object, initial encounter: Secondary | ICD-10-CM | POA: Diagnosis not present

## 2022-07-23 DIAGNOSIS — S99912A Unspecified injury of left ankle, initial encounter: Secondary | ICD-10-CM | POA: Diagnosis present

## 2022-07-23 DIAGNOSIS — M545 Low back pain, unspecified: Secondary | ICD-10-CM | POA: Insufficient documentation

## 2022-07-23 DIAGNOSIS — K5900 Constipation, unspecified: Secondary | ICD-10-CM | POA: Diagnosis not present

## 2022-07-23 NOTE — ED Triage Notes (Signed)
Pt reports hx of orthostatic hypotension, got up out of bed tonight quickly and felt lightheaded and felt backwards into bathroom onto her back. Pt reports mid-back pain and left ankle/foot pain. Unknown if she hit her head, denies headache. (-) LOC, (-) blood thinners. Sensation/mobility intact distal to injury.

## 2022-07-24 ENCOUNTER — Emergency Department (HOSPITAL_BASED_OUTPATIENT_CLINIC_OR_DEPARTMENT_OTHER)
Admission: EM | Admit: 2022-07-24 | Discharge: 2022-07-24 | Disposition: A | Discharge status: Home / Self Care | Payer: Commercial Managed Care - PPO | Attending: Emergency Medicine | Admitting: Emergency Medicine

## 2022-07-24 ENCOUNTER — Emergency Department (HOSPITAL_BASED_OUTPATIENT_CLINIC_OR_DEPARTMENT_OTHER): Payer: Commercial Managed Care - PPO

## 2022-07-24 DIAGNOSIS — K5900 Constipation, unspecified: Secondary | ICD-10-CM

## 2022-07-24 DIAGNOSIS — S92302A Fracture of unspecified metatarsal bone(s), left foot, initial encounter for closed fracture: Secondary | ICD-10-CM

## 2022-07-24 DIAGNOSIS — W19XXXA Unspecified fall, initial encounter: Secondary | ICD-10-CM

## 2022-07-24 MED ORDER — TRAMADOL HCL 50 MG PO TABS: 50.0000 mg | ORAL_TABLET | Freq: Four times a day (QID) | ORAL | 0 refills | Status: AC | PRN

## 2022-07-24 MED ORDER — KETOROLAC TROMETHAMINE 60 MG/2ML IM SOLN
30.0000 mg | Freq: Once | INTRAMUSCULAR | Status: AC
  Administered 2022-07-24: 30 mg via INTRAMUSCULAR
  Filled 2022-07-24: qty 2

## 2022-07-24 MED ORDER — LIDOCAINE 5 % EX PTCH: 2.0000 | MEDICATED_PATCH | CUTANEOUS | Status: DC

## 2022-07-24 MED ORDER — DICLOFENAC SODIUM ER 100 MG PO TB24: 100.0000 mg | ORAL_TABLET | Freq: Every day | ORAL | 0 refills | Status: AC

## 2022-07-24 NOTE — ED Provider Notes (Signed)
[facility] Provider Note   CSN: [id number] Arrival date & time: 07/23/22  2315     History  Chief Complaint  Patient presents with   Ankle Injury    Natalie Massey is a 56 y.o. female.  The history is provided by the patient.  Ankle Injury This is a new problem. The current episode started 3 to 5 hours ago. The problem occurs constantly. The problem has not changed since onset.Pertinent negatives include no abdominal pain. Nothing aggravates the symptoms. Nothing relieves the symptoms. The treatment provided no relief.  Patient hit left foot and ankle on a door frame when falling backwards this evening.  Also reports low back pain.       Home Medications Prior to Admission medications   Medication Sig Start Date End Date Taking? Authorizing Provider  Diclofenac Sodium CR 100 MG 24 hr tablet Take 1 tablet (100 mg total) by mouth daily. 07/24/22  Yes Ronne Stefanski, MD  traMADol (ULTRAM) 50 MG tablet Take 1 tablet (50 mg total) by mouth every 6 (six) hours as needed for severe pain. 07/24/22  Yes Suellyn Meenan, MD  methylPREDNISolone (MEDROL DOSEPAK) 4 MG TBPK tablet Follow package insert 12/30/21   Curatolo, Adam, DO      Allergies    Benadryl [diphenhydramine], Hydrocodone, and Prochlorperazine    Review of Systems   Review of Systems  Constitutional:  Negative for fever.  HENT:  Negative for facial swelling.   Eyes:  Negative for redness.  Respiratory:  Negative for wheezing and stridor.   Gastrointestinal:  Negative for abdominal pain.  Musculoskeletal:  Positive for arthralgias.  Neurological:  Negative for speech difficulty.  All other systems reviewed and are negative.   Physical Exam Updated Vital Signs BP (!) 93/56 (BP Location: Right Arm)   Pulse 77   Temp 98.7 F (37.1 C) (Oral)   Resp 15   Ht 5\' 6"  (1.676 m)   Wt 70.3 kg   SpO2 97%   BMI 25.02 kg/m  Physical Exam Vitals and nursing note reviewed.  Constitutional:       General: She is not in acute distress.    Appearance: Normal appearance. She is well-developed.  HENT:     Head: Normocephalic and atraumatic.     Right Ear: Tympanic membrane normal.     Left Ear: Tympanic membrane normal.     Nose: Nose normal.  Eyes:     Pupils: Pupils are equal, round, and reactive to light.  Cardiovascular:     Rate and Rhythm: Normal rate and regular rhythm.     Pulses: Normal pulses.     Heart sounds: Normal heart sounds.  Pulmonary:     Effort: Pulmonary effort is normal. No respiratory distress.     Breath sounds: Normal breath sounds.  Abdominal:     General: Bowel sounds are normal. There is no distension.     Palpations: Abdomen is soft.     Tenderness: There is no abdominal tenderness. There is no guarding or rebound.  Genitourinary:    Vagina: No vaginal discharge.  Musculoskeletal:        General: Tenderness present. Normal range of motion.     Cervical back: Normal range of motion and neck supple.     Right ankle: No deformity or ecchymosis. Normal range of motion. Normal pulse.     Right Achilles Tendon: Normal.     Left ankle: No deformity or ecchymosis. Normal range of motion. Normal pulse.  Left Achilles Tendon: Normal.     Left foot: Normal capillary refill. Tenderness present. No Charcot foot, foot drop or laceration. Normal pulse.  Skin:    General: Skin is warm and dry.     Capillary Refill: Capillary refill takes less than 2 seconds.     Findings: No erythema or rash.  Neurological:     General: No focal deficit present.     Mental Status: She is alert.     Deep Tendon Reflexes: Reflexes normal.  Psychiatric:        Mood and Affect: Mood normal.     ED Results / Procedures / Treatments   Labs (all labs ordered are listed, but only abnormal results are displayed) Labs Reviewed - No data to display  EKG None  Radiology DG Ankle Complete Left  Result Date: 07/24/2022 CLINICAL DATA:  Fall EXAM: LEFT ANKLE COMPLETE - 3+  VIEW COMPARISON:  None Available. FINDINGS: No fracture or dislocation is seen. The ankle mortise is intact. Mild lateral soft tissue swelling. IMPRESSION: No fracture or dislocation is seen. Mild lateral soft tissue swelling. Electronically Signed   By: Charline Bills M.D.   On: 07/24/2022 01:23   DG Foot Complete Left  Result Date: 07/24/2022 CLINICAL DATA:  Fall EXAM: LEFT FOOT - COMPLETE 3+ VIEW COMPARISON:  None Available. FINDINGS: Transverse fracture involving the proximal shaft of the 5th proximal phalanx. Mildly displaced oblique fracture involving the mid/distal 4th metatarsal shaft. Comminuted/segmental fracture involving the mid/distal 5th metatarsal, mildly displaced. Mild dorsal/lateral soft tissue swelling. IMPRESSION: Fractures involving the 4th and 5th metatarsals, as described above. Additional fracture involving the 5th proximal phalanx. Electronically Signed   By: Charline Bills M.D.   On: 07/24/2022 01:23   DG Lumbar Spine Complete  Result Date: 07/24/2022 CLINICAL DATA:  Fall EXAM: LUMBAR SPINE - COMPLETE 4+ VIEW COMPARISON:  None Available. FINDINGS: Five lumbar-type vertebral bodies. Normal lumbar lordosis. No evidence of fracture or dislocation. Vertebral body heights are maintained. Mild degenerative changes at L5-S1. Visualized bony pelvis appears intact. Moderate colonic stool burden. IMPRESSION: Negative. Electronically Signed   By: Charline Bills M.D.   On: 07/24/2022 01:22   CT Head Wo Contrast  Result Date: 07/24/2022 CLINICAL DATA:  Head trauma EXAM: CT HEAD WITHOUT CONTRAST TECHNIQUE: Contiguous axial images were obtained from the base of the skull through the vertex without intravenous contrast. RADIATION DOSE REDUCTION: This exam was performed according to the departmental dose-optimization program which includes automated exposure control, adjustment of the mA and/or kV according to patient size and/or use of iterative reconstruction technique. COMPARISON:   CT head 12/30/2021 FINDINGS: Brain: No intracranial hemorrhage, mass effect, or evidence of acute infarct. No hydrocephalus. No extra-axial fluid collection. Vascular: No hyperdense vessel or unexpected calcification. Skull: No fracture or focal lesion. Sinuses/Orbits: No acute finding. Mucosal thickening with air-fluid levels in both maxillary sinuses. Opacification of the right sphenoid sinus. Other: None. IMPRESSION: No acute intracranial abnormality. Maxillary and sphenoid sinus disease. Electronically Signed   By: Minerva Fester M.D.   On: 07/24/2022 00:52    Procedures Procedures    Medications Ordered in ED Medications  ketorolac (TORADOL) injection 30 mg (30 mg Intramuscular Given 07/24/22 0107)    ED Course/ Medical Decision Making/ A&P                           Medical Decision Making Patient who fell this evening with low back left foot and ankle pain  Problems Addressed: Closed displaced fracture of metatarsal bone of left foot, unspecified metatarsal, initial encounter: acute illness or injury    Details: Placed in cam walker given crutches follow up with orthopedics pain medication RX provided Constipation, unspecified constipation type:    Details: Seen on Xray of spine  Amount and/or Complexity of Data Reviewed Independent Historian: spouse    Details: See above  External Data Reviewed: notes.    Details: Previous notes reviewed  Radiology: ordered. ECG/medicine tests: ordered and independent interpretation performed.    Details: Negative head CT by me.  Constipation seen on lumbar spine by me.  Metatarsal fracture seen by me   Risk Prescription drug management. Risk Details: Cam walker placed and patient fitted for crutches.  Ice elevation pain medication and referral to orthopedic surgery for ongoing care.  Strict return.      Final Clinical Impression(s) / ED Diagnoses Final diagnoses:  Closed displaced fracture of metatarsal bone of left foot, unspecified  metatarsal, initial encounter  Constipation, unspecified constipation type  Fall, initial encounter    Return for intractable cough, coughing up blood, fevers > 100.4 unrelieved by medication, shortness of breath, intractable vomiting, chest pain, shortness of breath, weakness, numbness, changes in speech, facial asymmetry, abdominal pain, passing out, Inability to tolerate liquids or food, cough, altered mental status or any concerns. No signs of systemic illness or infection. The patient is nontoxic-appearing on exam and vital signs are within normal limits.  I have reviewed the triage vital signs and the nursing notes. Pertinent labs & imaging results that were available during my care of the patient were reviewed by me and considered in my medical decision making (see chart for details). After history, exam, and medical workup I feel the patient has been appropriately medically screened and is safe for discharge home. Pertinent diagnoses were discussed with the patient. Patient was given return precautions.  Rx / DC Orders ED Discharge Orders          Ordered    Diclofenac Sodium CR 100 MG 24 hr tablet  Daily        07/24/22 0107    traMADol (ULTRAM) 50 MG tablet  Every 6 hours PRN        07/24/22 0112              Kolina Kube, MD 07/24/22 0300
